# Patient Record
Sex: Male | Born: 1985 | Race: White | Hispanic: No | Marital: Married | Smoking: Never smoker
Health system: Southern US, Community
[De-identification: ages and names within clinical notes are randomized; demographics above are authoritative.]

---
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
## 2008-04-16 ENCOUNTER — Encounter: Admission: RE | Admit: 2008-04-16 | Discharge: 2008-04-16 | Payer: Self-pay | Admitting: Orthopedic Surgery

## 2014-08-28 ENCOUNTER — Ambulatory Visit: Payer: Self-pay | Admitting: Internal Medicine

## 2016-05-14 ENCOUNTER — Emergency Department (HOSPITAL_COMMUNITY)
Admission: EM | Admit: 2016-05-14 | Discharge: 2016-05-14 | Disposition: A | Discharge status: Home / Self Care | Payer: No Typology Code available for payment source | Attending: Emergency Medicine | Admitting: Emergency Medicine

## 2016-05-14 ENCOUNTER — Encounter (HOSPITAL_COMMUNITY): Payer: Self-pay | Admitting: Emergency Medicine

## 2016-05-14 DIAGNOSIS — S0911XA Strain of muscle and tendon of head, initial encounter: Secondary | ICD-10-CM | POA: Diagnosis not present

## 2016-05-14 DIAGNOSIS — S199XXA Unspecified injury of neck, initial encounter: Secondary | ICD-10-CM | POA: Diagnosis present

## 2016-05-14 DIAGNOSIS — Y9389 Activity, other specified: Secondary | ICD-10-CM | POA: Insufficient documentation

## 2016-05-14 DIAGNOSIS — Y999 Unspecified external cause status: Secondary | ICD-10-CM | POA: Diagnosis not present

## 2016-05-14 DIAGNOSIS — Y92410 Unspecified street and highway as the place of occurrence of the external cause: Secondary | ICD-10-CM | POA: Insufficient documentation

## 2016-05-14 DIAGNOSIS — S161XXA Strain of muscle, fascia and tendon at neck level, initial encounter: Secondary | ICD-10-CM | POA: Insufficient documentation

## 2016-05-14 DIAGNOSIS — T148XXA Other injury of unspecified body region, initial encounter: Secondary | ICD-10-CM

## 2016-05-14 MED ORDER — NAPROXEN 375 MG PO TABS: 375.0000 mg | ORAL_TABLET | Freq: Two times a day (BID) | ORAL | Status: DC

## 2016-05-14 MED ORDER — CYCLOBENZAPRINE HCL 5 MG PO TABS: 5.0000 mg | ORAL_TABLET | Freq: Two times a day (BID) | ORAL | Status: AC | PRN
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
## 2016-05-21 ENCOUNTER — Emergency Department
Admission: EM | Admit: 2016-05-21 | Discharge: 2016-05-21 | Disposition: A | Discharge status: Home / Self Care | Payer: No Typology Code available for payment source | Attending: Emergency Medicine | Admitting: Emergency Medicine

## 2016-05-21 ENCOUNTER — Emergency Department: Payer: No Typology Code available for payment source

## 2016-05-21 ENCOUNTER — Encounter: Payer: Self-pay | Admitting: Emergency Medicine

## 2016-05-21 DIAGNOSIS — Y92410 Unspecified street and highway as the place of occurrence of the external cause: Secondary | ICD-10-CM | POA: Insufficient documentation

## 2016-05-21 DIAGNOSIS — M549 Dorsalgia, unspecified: Secondary | ICD-10-CM | POA: Diagnosis not present

## 2016-05-21 DIAGNOSIS — Z79899 Other long term (current) drug therapy: Secondary | ICD-10-CM | POA: Insufficient documentation

## 2016-05-21 DIAGNOSIS — S20211A Contusion of right front wall of thorax, initial encounter: Secondary | ICD-10-CM | POA: Insufficient documentation

## 2016-05-21 DIAGNOSIS — Y999 Unspecified external cause status: Secondary | ICD-10-CM | POA: Insufficient documentation

## 2016-05-21 DIAGNOSIS — S299XXA Unspecified injury of thorax, initial encounter: Secondary | ICD-10-CM | POA: Diagnosis present

## 2016-05-21 DIAGNOSIS — Y939 Activity, unspecified: Secondary | ICD-10-CM | POA: Insufficient documentation

## 2016-05-21 MED ORDER — IBUPROFEN 600 MG PO TABS: 600.0000 mg | ORAL_TABLET | Freq: Three times a day (TID) | ORAL | 0 refills | Status: AC | PRN
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
## 2016-05-21 NOTE — Discharge Instructions (Signed)
Follow-up with [facility] clinic if any continued problems. Discontinue naproxen due to stomach upset. Begin taking ibuprofen 3 times a day with food. Continue taking Flexeril (your muscle relaxant) at bedtime.
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
## 2023-05-08 ENCOUNTER — Ambulatory Visit
Admission: EM | Admit: 2023-05-08 | Discharge: 2023-05-08 | Disposition: A | Discharge status: Home / Self Care | Payer: BC Managed Care – PPO | Attending: Family Medicine | Admitting: Family Medicine

## 2023-05-08 DIAGNOSIS — J351 Hypertrophy of tonsils: Secondary | ICD-10-CM | POA: Diagnosis not present

## 2023-05-08 DIAGNOSIS — J029 Acute pharyngitis, unspecified: Secondary | ICD-10-CM

## 2023-05-08 LAB — POCT RAPID STREP A (OFFICE): Rapid Strep A Screen: NEGATIVE

## 2023-05-08 MED ORDER — PREDNISONE 20 MG PO TABS: 40.0000 mg | ORAL_TABLET | Freq: Every day | ORAL | 0 refills | Status: AC

## 2023-05-08 NOTE — ED Provider Notes (Signed)
[facility]    CSN: [id number] Arrival date & time: 05/08/23  1342      History   Chief Complaint Chief Complaint  Patient presents with   Sore Throat    HPI Earl Rodriguez is a 37 y.o. male.   HPI Patient with a 4 week history of sore throat. Completed a telehealth visit x 3 weeks ago and took 10 days of Amoxicillin and finished 5 days taper of prednisolone x one day.  He reports that the prednisone alleviated symptoms better than the antibiotic.  He reports no history of recurrent esophagitis or strep.  He has had no sick contacts and has not had fever in over 4 weeks.  He reports while taking the prednisone symptoms swallowing and enlarged tonsil seemed to improve. This is a new problem and has never had any problems with acute pharyngitis.  History reviewed. No pertinent past medical history.  There are no problems to display for this patient.   History reviewed. No pertinent surgical history.     Home Medications    Prior to Admission medications   Medication Sig Start Date End Date Taking? Authorizing Provider  predniSONE (DELTASONE) 20 MG tablet Take 2 tablets (40 mg total) by mouth daily with breakfast for 5 days. 05/08/23 05/13/23 Yes Bing Neighbors, NP  cyclobenzaprine (FLEXERIL) 5 MG tablet Take 1 tablet (5 mg total) by mouth 2 (two) times daily as needed for muscle spasms. 05/14/16   Arthor Captain, PA-C  ibuprofen (ADVIL,MOTRIN) 600 MG tablet Take 1 tablet (600 mg total) by mouth every 8 (eight) hours as needed. 05/21/16   Tommi Rumps, PA-C    Family History No family history on file.  Social History Social History   Tobacco Use   Smoking status: Never   Smokeless tobacco: Never  Substance Use Topics   Alcohol use: No     Allergies   Patient has no known allergies.   Review of Systems Review of Systems Pertinent negatives listed in HPI   Physical Exam Triage Vital Signs ED Triage Vitals  Encounter Vitals Group     BP  05/08/23 1356 119/77     Systolic BP Percentile --      Diastolic BP Percentile --      Pulse Rate 05/08/23 1356 81     Resp 05/08/23 1356 18     Temp 05/08/23 1356 98.3 F (36.8 C)     Temp src --      SpO2 05/08/23 1356 98 %     Weight --      Height --      Head Circumference --      Peak Flow --      Pain Score 05/08/23 1354 4     Pain Loc --      Pain Education --      Exclude from Growth Chart --    No data found.  Updated Vital Signs BP 119/77   Pulse 81   Temp 98.3 F (36.8 C)   Resp 18   SpO2 98%   Visual Acuity Right Eye Distance:   Left Eye Distance:   Bilateral Distance:    Right Eye Near:   Left Eye Near:    Bilateral Near:     Physical Exam Vitals reviewed.  Constitutional:      Appearance: He is well-developed.  HENT:     Head: Normocephalic and atraumatic.     Nose: No congestion or rhinorrhea.  Mouth/Throat:     Tonsils: No tonsillar exudate or tonsillar abscesses. 3+ on the right. 3+ on the left.  Cardiovascular:     Rate and Rhythm: Normal rate and regular rhythm.  Pulmonary:     Effort: Pulmonary effort is normal.     Breath sounds: Normal breath sounds.  Musculoskeletal:     Cervical back: Normal range of motion and neck supple.  Lymphadenopathy:     Cervical: No cervical adenopathy.  Skin:    General: Skin is warm and dry.  Neurological:     General: No focal deficit present.     Mental Status: He is alert.      UC Treatments / Results  Labs (all labs ordered are listed, but only abnormal results are displayed) Labs Reviewed  POCT RAPID STREP A (OFFICE)    EKG   Radiology No results found.  Procedures Procedures (including critical care time)  Medications Ordered in UC Medications - No data to display  Initial Impression / Assessment and Plan / UC Course  I have reviewed the triage vital signs and the nursing notes.  Pertinent labs & imaging results that were available during my care of the patient were  reviewed by me and considered in my medical decision making (see chart for details).   Rapid strep is negative.  Given the duration of symptoms and patient has prominent tonsil hypertrophy would recommend evaluation by ENT.  Will trial another 5 days of prednisone while patient works to get in with the ENT.  Patient given strict instructions to follow-up immediately in the next business day by calling the office listed on his discharge paperwork.  Patient verbalized understanding and agreement with plan. Final Clinical Impressions(s) / UC Diagnoses   Final diagnoses:  Acute pharyngitis, unspecified etiology  Tonsillar hypertrophy     Discharge Instructions      I have extended prednisone for additional 5 days with prednisone 40 mg daily for 5 days. Call ENT was your strep test was negative here and I recommend further specialty work-up as to the cause of your symptoms      ED Prescriptions     Medication Sig Dispense Auth. Provider   predniSONE (DELTASONE) 20 MG tablet Take 2 tablets (40 mg total) by mouth daily with breakfast for 5 days. 10 tablet Bing Neighbors, NP      PDMP not reviewed this encounter.   Bing Neighbors, NP 05/11/23 [phone]

## 2023-05-08 NOTE — ED Triage Notes (Signed)
Patient to Urgent Care with complaints of sore throat that started four weeks ago.   Reports fevers at the beginning of his illness. Amoxicillin prescribed via teledoc. Two weeks after, prescribed a prednisolone which he completed yesterday. Reports symptoms returned again last night.  No recent fevers.

## 2023-05-08 NOTE — Discharge Instructions (Signed)
I have extended prednisone for additional 5 days with prednisone 40 mg daily for 5 days. Call ENT was your strep test was negative here and I recommend further specialty work-up as to the cause of your symptoms
